# Patient Record
Sex: Female | Born: 1985
Health system: Southern US, Community
[De-identification: ages and names within clinical notes are randomized; demographics above are authoritative.]

## PROBLEM LIST (undated history)

## (undated) DIAGNOSIS — T7840XA Allergy, unspecified, initial encounter: Secondary | ICD-10-CM

## (undated) DIAGNOSIS — G43909 Migraine, unspecified, not intractable, without status migrainosus: Secondary | ICD-10-CM

## (undated) DIAGNOSIS — F909 Attention-deficit hyperactivity disorder, unspecified type: Secondary | ICD-10-CM

## (undated) DIAGNOSIS — E282 Polycystic ovarian syndrome: Secondary | ICD-10-CM

## (undated) HISTORY — PX: BREAST SURGERY: SHX581

## (undated) HISTORY — DX: Polycystic ovarian syndrome: E28.2

## (undated) HISTORY — DX: Attention-deficit hyperactivity disorder, unspecified type: F90.9

## (undated) HISTORY — DX: Allergy, unspecified, initial encounter: T78.40XA

## (undated) HISTORY — PX: WISDOM TOOTH EXTRACTION: SHX21

## (undated) HISTORY — PX: ORIF WRIST FRACTURE: SHX2133

## (undated) HISTORY — DX: Migraine, unspecified, not intractable, without status migrainosus: G43.909

---

## 2019-01-26 ENCOUNTER — Other Ambulatory Visit: Payer: Self-pay

## 2019-01-26 ENCOUNTER — Encounter: Payer: Self-pay | Admitting: Family Medicine

## 2019-01-26 ENCOUNTER — Ambulatory Visit: Payer: 59 | Admitting: Family Medicine

## 2019-01-26 VITALS — BP 98/60 | HR 78 | Temp 98.3°F | Ht 65.0 in | Wt 135.0 lb

## 2019-01-26 DIAGNOSIS — F900 Attention-deficit hyperactivity disorder, predominantly inattentive type: Secondary | ICD-10-CM | POA: Diagnosis not present

## 2019-01-26 DIAGNOSIS — Z7689 Persons encountering health services in other specified circumstances: Secondary | ICD-10-CM

## 2019-01-26 DIAGNOSIS — Z6822 Body mass index (BMI) 22.0-22.9, adult: Secondary | ICD-10-CM | POA: Diagnosis not present

## 2019-01-26 DIAGNOSIS — Z309 Encounter for contraceptive management, unspecified: Secondary | ICD-10-CM | POA: Diagnosis not present

## 2019-01-26 DIAGNOSIS — Z01419 Encounter for gynecological examination (general) (routine) without abnormal findings: Secondary | ICD-10-CM | POA: Diagnosis not present

## 2019-01-26 MED ORDER — AMPHETAMINE-DEXTROAMPHETAMINE 15 MG PO TABS: 15.0000 mg | ORAL_TABLET | Freq: Every day | ORAL | 0 refills | Status: DC

## 2019-01-26 MED FILL — DEXTROAMP-AMPHETAMIN 15 MG: 15 | 30 days supply | Qty: 30 | Fill #0

## 2019-01-26 MED FILL — DROSPIRENONE-EE 3-0.03 MG T: 3-0.03 | 84 days supply | Qty: 84 | Fill #0

## 2019-01-31 ENCOUNTER — Encounter: Payer: Self-pay | Admitting: Family Medicine

## 2019-01-31 NOTE — Progress Notes (Signed)
Patient presents to clinic today to establish care.  SUBJECTIVE: PMH: Pt is a 33 yo female with pmh sig for ADHD.  Pt previously seen in [place], [place].  ADHD-inattentive: -diagnosed while in med school -has letter from diagnosing physician  09/13/2009 -Adderall 15 mg daily -does not need every day.  Takes consistently when studying.  Has specialty boards in a few months.  Allergies: Bactrim- rash Chlorhexidine- rash  Past Surgical hx:  L wrist fx  Social Hx: Pt is single.  Pt went to medical school at the [employer] [place].  She is a Pulm/critical care physician.  Pt denies tobacco or drug use.  Pt endorses social EtOH use.  Health Maintenance: Immunizations -- tdap 01/03/2016 Followed by Rushie Chestnut  Family Medical hx: Siblings-ADHD  Past Medical History:  Diagnosis Date  . ADHD   . Allergy   . Migraine   . PCOS (polycystic ovarian syndrome)     Past Surgical History:  Procedure Laterality Date  . BREAST SURGERY    . ORIF WRIST FRACTURE    . WISDOM TOOTH EXTRACTION      Current Outpatient Medications on File Prior to Visit  Medication Sig Dispense Refill  . cetirizine (ZYRTEC) 10 MG tablet Take 10 mg by mouth daily.     No current facility-administered medications on file prior to visit.     Allergies  Allergen Reactions  . Bactrim [Sulfamethoxazole-Trimethoprim] Rash  . Chlorhexidine Rash    Family History  Problem Relation Age of Onset  . Hypertension Mother   . Miscarriages / [place] Mother   . Arthritis Father   . Depression Father   . Hyperlipidemia Father   . Learning disabilities Father   . Depression Sister   . Learning disabilities Sister   . Learning disabilities Brother   . Asthma Brother   . Cancer Maternal Grandfather   . Heart attack Maternal Grandfather   . Heart attack Paternal Grandmother   . Heart disease Paternal Grandmother     Social History   Socioeconomic History  . Marital status: Single    Spouse name: Not on  file  . Number of children: Not on file  . Years of education: Not on file  . Highest education level: Not on file  Occupational History  . Not on file  Social Needs  . Financial resource strain: Not on file  . Food insecurity    Worry: Not on file    Inability: Not on file  . Transportation needs    Medical: Not on file    Non-medical: Not on file  Tobacco Use  . Smoking status: Never Smoker  . Smokeless tobacco: Never Used  Substance and Sexual Activity  . Alcohol use: Yes    Comment: occassional  . Drug use: Never  . Sexual activity: Not Currently  Lifestyle  . Physical activity    Days per week: Not on file    Minutes per session: Not on file  . Stress: Not on file  Relationships  . Social Herbalist on phone: Not on file    Gets together: Not on file    Attends religious service: Not on file    Active member of club or organization: Not on file    Attends meetings of clubs or organizations: Not on file    Relationship status: Not on file  . Intimate partner violence    Fear of current or ex partner: Not on file    Emotionally  abused: Not on file    Physically abused: Not on file    Forced sexual activity: Not on file  Other Topics Concern  . Not on file  Social History Narrative  . Not on file    ROS General: Denies fever, chills, night sweats, changes in weight, changes in appetite HEENT: Denies headaches, ear pain, changes in vision, rhinorrhea, sore throat CV: Denies CP, palpitations, SOB, orthopnea Pulm: Denies SOB, cough, wheezing GI: Denies abdominal pain, nausea, vomiting, diarrhea, constipation GU: Denies dysuria, hematuria, frequency, vaginal discharge Msk: Denies muscle cramps, joint pains Neuro: Denies weakness, numbness, tingling Skin: Denies rashes, bruising Psych: Denies depression, anxiety, hallucinations  +ADHD  BP 98/60 (BP Location: Right Arm, Patient Position: Sitting, Cuff Size: Normal)   Pulse 78   Temp 98.3 F (36.8 C)  (Oral)   Ht 5\' 5"  (1.651 m)   Wt 135 lb (61.2 kg)   LMP 01/15/2019 (Exact Date)   SpO2 98%   BMI 22.47 kg/m   Physical Exam Gen. Pleasant, well developed, well-nourished, in NAD HEENT - [place]/AT, PERRL, conjunctive clear, no scleral icterus, no nasal drainage Lungs: no use of accessory muscles, CTAB, no wheezes, rales or rhonchi Cardiovascular: RRR, No r/g/m, no peripheral edema Abdomen: BS present, soft, nontender,nondistended Musculoskeletal: No deformities, moves all four extremities, no cyanosis or clubbing, normal tone Neuro:  A&Ox3, CN II-XII intact, normal gait Skin:  Warm, dry, intact, no lesions  No results found for this or any previous visit (from the past 2160 hour(s)).  Assessment/Plan: Attention deficit hyperactivity disorder (ADHD), predominantly inattentive type  -records reviewed  Dx'd with ADHD in 2011 -continue stimulants - Plan: amphetamine-dextroamphetamine (ADDERALL) 15 MG tablet  Encounter to establish care -We reviewed the PMH, PSH, FH, SH, Meds and Allergies. -We provided refills for any medications we will prescribe as needed. -We addressed current concerns per orders and patient instructions. -We have asked for records for pertinent exams, studies, vaccines and notes from previous providers. -We have advised patient to follow up per instructions below.  F/u prn  Grier Mitts, MD

## 2019-04-10 MED FILL — DROSPIRENONE-EE 3-0.03 MG T: 3-0.03 | 84 days supply | Qty: 84 | Fill #1

## 2019-05-22 DIAGNOSIS — Z23 Encounter for immunization: Secondary | ICD-10-CM | POA: Diagnosis not present

## 2019-05-22 DIAGNOSIS — H019 Unspecified inflammation of eyelid: Secondary | ICD-10-CM | POA: Diagnosis not present

## 2019-05-22 DIAGNOSIS — L309 Dermatitis, unspecified: Secondary | ICD-10-CM | POA: Diagnosis not present

## 2019-05-22 MED FILL — DESONIDE 0.05% CREAM: 0.05 | 14 days supply | Qty: 15 | Fill #0

## 2019-07-03 DIAGNOSIS — L814 Other melanin hyperpigmentation: Secondary | ICD-10-CM | POA: Diagnosis not present

## 2019-07-03 DIAGNOSIS — L813 Cafe au lait spots: Secondary | ICD-10-CM | POA: Diagnosis not present

## 2019-07-03 DIAGNOSIS — Z86018 Personal history of other benign neoplasm: Secondary | ICD-10-CM | POA: Diagnosis not present

## 2019-07-03 DIAGNOSIS — Z23 Encounter for immunization: Secondary | ICD-10-CM | POA: Diagnosis not present

## 2019-07-03 DIAGNOSIS — L578 Other skin changes due to chronic exposure to nonionizing radiation: Secondary | ICD-10-CM | POA: Diagnosis not present

## 2019-07-03 DIAGNOSIS — D225 Melanocytic nevi of trunk: Secondary | ICD-10-CM | POA: Diagnosis not present

## 2019-07-14 MED FILL — DROSPIRENONE-EE 3-0.03 MG T: 3-0.03 | 84 days supply | Qty: 84 | Fill #2

## 2019-10-17 MED FILL — DROSPIRENONE-EE 3-0.03 MG T: 3-0.03 | 84 days supply | Qty: 84 | Fill #3

## 2019-11-20 ENCOUNTER — Other Ambulatory Visit (HOSPITAL_COMMUNITY): Payer: Self-pay | Admitting: Dentistry

## 2019-11-20 MED FILL — PREVIDENT 5000 BOOSTER PLUS: 1.1 | 30 days supply | Qty: 100 | Fill #0

## 2020-01-30 ENCOUNTER — Other Ambulatory Visit (HOSPITAL_COMMUNITY): Payer: Self-pay | Admitting: Obstetrics and Gynecology

## 2020-01-30 DIAGNOSIS — Z6821 Body mass index (BMI) 21.0-21.9, adult: Secondary | ICD-10-CM | POA: Diagnosis not present

## 2020-01-30 DIAGNOSIS — Z309 Encounter for contraceptive management, unspecified: Secondary | ICD-10-CM | POA: Diagnosis not present

## 2020-01-30 DIAGNOSIS — Z01419 Encounter for gynecological examination (general) (routine) without abnormal findings: Secondary | ICD-10-CM | POA: Diagnosis not present

## 2020-01-30 MED FILL — DROSPIRENONE-EE 3-0.03 MG T: 3-0.03 | 84 days supply | Qty: 84 | Fill #0

## 2020-05-13 MED FILL — DROSPIRENONE-EE 3-0.03 MG T: 3-0.03 | 84 days supply | Qty: 84 | Fill #1

## 2020-06-04 MED FILL — PREVIDENT 5000 BOOSTER PLUS: 1.1 | 30 days supply | Qty: 100 | Fill #1

## 2020-07-11 ENCOUNTER — Other Ambulatory Visit (HOSPITAL_COMMUNITY): Payer: Self-pay | Admitting: General Surgery

## 2020-07-11 DIAGNOSIS — L578 Other skin changes due to chronic exposure to nonionizing radiation: Secondary | ICD-10-CM | POA: Diagnosis not present

## 2020-07-11 DIAGNOSIS — H019 Unspecified inflammation of eyelid: Secondary | ICD-10-CM | POA: Diagnosis not present

## 2020-07-11 DIAGNOSIS — L813 Cafe au lait spots: Secondary | ICD-10-CM | POA: Diagnosis not present

## 2020-07-11 DIAGNOSIS — L814 Other melanin hyperpigmentation: Secondary | ICD-10-CM | POA: Diagnosis not present

## 2020-07-11 DIAGNOSIS — Z86018 Personal history of other benign neoplasm: Secondary | ICD-10-CM | POA: Diagnosis not present

## 2020-07-11 DIAGNOSIS — D225 Melanocytic nevi of trunk: Secondary | ICD-10-CM | POA: Diagnosis not present

## 2020-07-11 MED FILL — DESONIDE 0.05% CREAM: 0.05 | 14 days supply | Qty: 15 | Fill #0

## 2020-07-11 MED FILL — TACROLIMUS 0.1% OINTMENT: 0.1 | 7 days supply | Qty: 30 | Fill #0

## 2020-07-23 MED FILL — DROSPIRENONE-EE 3-0.03 MG T: 3-0.03 | 84 days supply | Qty: 84 | Fill #2

## 2020-07-26 ENCOUNTER — Ambulatory Visit (HOSPITAL_COMMUNITY)
Admission: EM | Admit: 2020-07-26 | Discharge: 2020-07-26 | Disposition: A | Discharge status: Home / Self Care | Payer: 59 | Attending: Emergency Medicine | Admitting: Emergency Medicine

## 2020-07-26 ENCOUNTER — Other Ambulatory Visit (HOSPITAL_COMMUNITY): Payer: Self-pay | Admitting: Emergency Medicine

## 2020-07-26 ENCOUNTER — Encounter (HOSPITAL_COMMUNITY): Payer: Self-pay

## 2020-07-26 ENCOUNTER — Other Ambulatory Visit: Payer: Self-pay

## 2020-07-26 DIAGNOSIS — J039 Acute tonsillitis, unspecified: Secondary | ICD-10-CM | POA: Diagnosis not present

## 2020-07-26 DIAGNOSIS — J029 Acute pharyngitis, unspecified: Secondary | ICD-10-CM

## 2020-07-26 LAB — POCT RAPID STREP A, ED / UC: Streptococcus, Group A Screen (Direct): NEGATIVE

## 2020-07-26 MED ORDER — AMOXICILLIN 500 MG PO CAPS: 500.0000 mg | ORAL_CAPSULE | Freq: Two times a day (BID) | ORAL | 0 refills | Status: AC

## 2020-07-26 MED FILL — AMOXICILLIN 500 MG CAPSULE: 500 | 7 days supply | Qty: 14 | Fill #0

## 2020-07-26 NOTE — ED Triage Notes (Signed)
Pt in with c/o ST that started last night. States she has noticed white spots on the left side of her throat  Pt has not had medication for sxs

## 2020-07-26 NOTE — ED Provider Notes (Signed)
[facility]   [id number] 07/26/20 Arrival Time: 0801  [place] THROAT  SUBJECTIVE: History from: patient.  Allison Mann is a 35 y.o. female who presents with abrupt onset of sore throat that started over night.  Denies sick exposure to Covid, strep, flu or mono, or precipitating event. Has not tried any OTC treatment. Symptoms are made worse with swallowing, but tolerating liquids and own secretions without difficulty.  Denies previous symptoms in the past.     Denies fever, chills, fatigue, ear pain, sinus pain, rhinorrhea, nasal congestion, cough, SOB, wheezing, chest pain, nausea, rash, changes in bowel or bladder habits.     ROS: As per HPI.  All other pertinent ROS negative.     Past Medical History:  Diagnosis Date  . ADHD   . Allergy   . Migraine   . PCOS (polycystic ovarian syndrome)    Past Surgical History:  Procedure Laterality Date  . BREAST SURGERY    . ORIF WRIST FRACTURE    . WISDOM TOOTH EXTRACTION     Allergies  Allergen Reactions  . Bactrim [Sulfamethoxazole-Trimethoprim] Rash  . Chlorhexidine Rash   No current facility-administered medications on file prior to encounter.   Current Outpatient Medications on File Prior to Encounter  Medication Sig Dispense Refill  . fluticasone (FLONASE) 50 MCG/ACT nasal spray Flonase 50 mcg/actuation nasal spray,suspension  Prescribed by non [facility] MD    . amphetamine-dextroamphetamine (ADDERALL) 15 MG tablet Take 1 tablet by mouth daily. 30 tablet 0  . cetirizine (ZYRTEC) 10 MG tablet Take 10 mg by mouth daily.     Social History   Socioeconomic History  . Marital status: Single    Spouse name: Not on file  . Number of children: Not on file  . Years of education: Not on file  . Highest education level: Not on file  Occupational History  . Not on file  Tobacco Use  . Smoking status: Never Smoker  . Smokeless tobacco: Never Used  Substance and Sexual Activity  . Alcohol use: Yes    Comment:  occassional  . Drug use: Never  . Sexual activity: Not Currently  Other Topics Concern  . Not on file  Social History Narrative  . Not on file   Social Determinants of Health   Financial Resource Strain: Not on file  Food Insecurity: Not on file  Transportation Needs: Not on file  Physical Activity: Not on file  Stress: Not on file  Social Connections: Not on file  Intimate Partner Violence: Not on file   Family History  Problem Relation Age of Onset  . Hypertension Mother   . Miscarriages / [place] Mother   . Arthritis Father   . Depression Father   . Hyperlipidemia Father   . Learning disabilities Father   . Depression Sister   . Learning disabilities Sister   . Learning disabilities Brother   . Asthma Brother   . Cancer Maternal Grandfather   . Heart attack Maternal Grandfather   . Heart attack Paternal Grandmother   . Heart disease Paternal Grandmother     OBJECTIVE:  Vitals:   07/26/20 0815  BP: (!) 144/95  Pulse: 76  Resp: 18  Temp: 98.9 F (37.2 C)  SpO2: 98%     General appearance: alert; appears fatigued, but nontoxic, speaking in full sentences and managing own secretions HEENT: NCAT; Ears: EACs clear, TMs pearly gray with visible cone of light, without erythema; Eyes: PERRL, EOMI grossly; Nose: no obvious rhinorrhea; Throat: oropharynx  clear, tonsils 1+ and mildly erythematous with white tonsillar exudate, uvula midline Neck: supple without LAD Lungs: CTA bilaterally without adventitious breath sounds; cough absent Heart: regular rate and rhythm.  Radial pulses 2+ symmetrical bilaterally Skin: warm and dry Psychological: alert and cooperative; normal mood and affect  LABS: Results for orders placed or performed during the hospital encounter of 07/26/20 (from the past 24 hour(s))  POCT Rapid Strep A     Status: None   Collection Time: 07/26/20  8:52 AM  Result Value Ref Range   Streptococcus, Group A Screen (Direct) NEGATIVE NEGATIVE      ASSESSMENT & PLAN:  1. Tonsillitis     Meds ordered this encounter  Medications  . amoxicillin (AMOXIL) 500 MG capsule    Sig: Take 1 capsule (500 mg total) by mouth 2 (two) times daily for 7 days.    Dispense:  14 capsule    Refill:  0    Order Specific Question:   Supervising Provider    Answer:   Chase Picket [id number]   Tonsillitis.  Prescribed amoxicillin for tonsillitis based on assessment.  Strep test negative, will send out for culture and we will call you with results Declines test for mono at this time Get plenty of rest and push fluids Take OTC Zyrtec and use chloraseptic spray as needed for throat pain. Drink warm or cool liquids, use throat lozenges, or popsicles to help alleviate symptoms Take OTC ibuprofen or tylenol as needed for pain Follow up with PCP if symptoms persists Return or go to ER if patient has any new or worsening symptoms such as fever, chills, nausea, vomiting, worsening sore throat, cough, abdominal pain, chest pain, changes in bowel or bladder habits  Reviewed expectations re: course of current medical issues. Questions answered. Outlined signs and symptoms indicating need for more acute intervention. Patient verbalized understanding. After Visit Summary given.          Pearson Forster, NP 07/26/20 [phone]

## 2020-07-26 NOTE — Discharge Instructions (Addendum)
Take antibiotics as prescribed.   You may also use Ibuprofen/Tylenol as needed for fever reduction and pain relief.  Warm beverages, including hot teas and hot water with lemon, can help to relieve throat pain.  You can also gargle salt water and use throat sprays/lozenges.    Return or go to the Emergency Department if symptoms worsen or do not improve in the next few days.

## 2020-07-28 LAB — CULTURE, GROUP A STREP (THRC)

## 2020-09-17 ENCOUNTER — Other Ambulatory Visit: Payer: Self-pay

## 2020-09-18 ENCOUNTER — Encounter: Payer: Self-pay | Admitting: Family Medicine

## 2020-09-18 ENCOUNTER — Ambulatory Visit (INDEPENDENT_AMBULATORY_CARE_PROVIDER_SITE_OTHER): Payer: 59 | Admitting: Family Medicine

## 2020-09-18 VITALS — BP 108/78 | HR 56 | Temp 98.1°F | Ht 66.0 in | Wt 131.2 lb

## 2020-09-18 DIAGNOSIS — Z131 Encounter for screening for diabetes mellitus: Secondary | ICD-10-CM | POA: Diagnosis not present

## 2020-09-18 DIAGNOSIS — Z0000 Encounter for general adult medical examination without abnormal findings: Secondary | ICD-10-CM | POA: Diagnosis not present

## 2020-09-18 DIAGNOSIS — F900 Attention-deficit hyperactivity disorder, predominantly inattentive type: Secondary | ICD-10-CM

## 2020-09-18 DIAGNOSIS — Z1159 Encounter for screening for other viral diseases: Secondary | ICD-10-CM

## 2020-09-18 DIAGNOSIS — Z13220 Encounter for screening for lipoid disorders: Secondary | ICD-10-CM | POA: Diagnosis not present

## 2020-09-18 NOTE — Progress Notes (Signed)
Subjective:     Allison Mann is a 35 y.o. female and is here for a comprehensive physical exam. The patient reports she has been doing well.  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  Pt using desonide on eyelids for eczema.  If flare is not controlled she uses tacrolimus x1 week.  Followed by dermatology.  Patient with a history of ADHD.  Not currently on Adderall as able to manage daily activities.  Last used medication when had to study for boards/a major exam.  Followed by OB/Gyn.  Social History   Socioeconomic History  . Marital status: Single    Spouse name: Not on file  . Number of children: Not on file  . Years of education: Not on file  . Highest education level: Not on file  Occupational History  . Not on file  Tobacco Use  . Smoking status: Never Smoker  . Smokeless tobacco: Never Used  Substance and Sexual Activity  . Alcohol use: Yes    Comment: occassional  . Drug use: Never  . Sexual activity: Not Currently  Other Topics Concern  . Not on file  Social History Narrative  . Not on file   Social Determinants of Health   Financial Resource Strain: Not on file  Food Insecurity: Not on file  Transportation Needs: Not on file  Physical Activity: Not on file  Stress: Not on file  Social Connections: Not on file  Intimate Partner Violence: Not on file   Health Maintenance  Topic Date Due  . Hepatitis C Screening  Never done  . HIV Screening  Never done  . TETANUS/TDAP  Never done  . PAP SMEAR-Modifier  Never done  . INFLUENZA VACCINE  12/23/2020  . COVID-19 Vaccine  Completed  . HPV VACCINES  Aged Out    The following portions of the patient's history were reviewed and updated as appropriate: allergies, current medications, past family history, past medical history, past social history, past surgical history and problem list.  Review of Systems A comprehensive review of systems was negative.   Objective:    BP 108/78 (BP Location: Left  Arm, Patient Position: Sitting, Cuff Size: Normal)   Pulse (!) 56   Temp 98.1 F (36.7 C) (Oral)   Ht 5\' 6"  (1.676 m)   Wt 131 lb 3.2 oz (59.5 kg)   LMP 09/08/2020   SpO2 99%   BMI 21.18 kg/m  General appearance: alert, cooperative and no distress Head: Normocephalic, without obvious abnormality, atraumatic Eyes: conjunctivae/corneas clear. PERRL, EOM's intact. Fundi benign. Ears: normal TM's and external ear canals both ears Nose: Nares normal. Septum midline. Mucosa normal. No drainage or sinus tenderness. Throat: lips, mucosa, and tongue normal; teeth and gums normal Neck: no adenopathy, no carotid bruit, no JVD, supple, symmetrical, trachea midline and thyroid not enlarged, symmetric, no tenderness/mass/nodules Lungs: clear to auscultation bilaterally Heart: regular rate and rhythm, S1, S2 normal, no murmur, click, rub or gallop Abdomen: soft, non-tender; bowel sounds normal; no masses,  no organomegaly Extremities: extremities normal, atraumatic, no cyanosis or edema Pulses: 2+ and symmetric Skin: Skin color, texture, turgor normal. No rashes or lesions Lymph nodes: Cervical, supraclavicular, and axillary nodes normal. Neurologic: Alert and oriented X 3, normal strength and tone. Normal symmetric reflexes. Normal coordination and gait    Assessment:    Healthy female exam.      Plan:     Anticipatory guidance given including wearing seatbelts, smoke detectors in the home, increasing physical activity,  increasing p.o. intake of water and vegetables. -will obtain labs -Pap up-to-date is followed by OB/GYN -Immunizations up-to-date -Next CPE in 1 year See After Visit Summary for Counseling Recommendations    Screening for cholesterol level  - Plan: Lipid panel  Screening for diabetes mellitus  - Plan: Hemoglobin A1c  Encounter for hepatitis C screening test for low risk patient  - Plan: Hep C Antibody  Attention deficit hyperactivity disorder (ADHD), predominantly  inattentive type -Stable -Not currently on medication -PDMP reviewed -If needed will restart Adderall or discussed other options.  Follow-up as needed  Grier Mitts, MD

## 2020-09-19 LAB — HEPATITIS C ANTIBODY: Hep C Virus Ab: <0.1 s/co ratio (ref 0.0–0.9)

## 2020-09-19 LAB — T4, FREE: Free T4: 1.04 ng/dL (ref 0.82–1.77)

## 2020-09-25 NOTE — Progress Notes (Signed)
Spoke with patient, is aware. 

## 2020-10-15 ENCOUNTER — Encounter: Payer: Self-pay | Admitting: Family Medicine

## 2020-10-24 ENCOUNTER — Other Ambulatory Visit (HOSPITAL_COMMUNITY): Payer: Self-pay

## 2020-10-24 MED FILL — Drospirenone-Ethinyl Estradiol Tab 3-0.03 MG: ORAL | 84 days supply | Qty: 84 | Fill #0 | Status: AC

## 2020-11-06 NOTE — Telephone Encounter (Signed)
Spoke with patient, informed of faxed results. Dr. Volanda Napoleon looked them over, totoal cholesterol was mildly elevated at 228, LDL mildly elevated at 122, other labs are normal.

## 2020-11-06 NOTE — Telephone Encounter (Signed)
A1c is 5.3%

## 2020-12-11 ENCOUNTER — Other Ambulatory Visit (HOSPITAL_BASED_OUTPATIENT_CLINIC_OR_DEPARTMENT_OTHER): Payer: Self-pay

## 2021-01-16 ENCOUNTER — Other Ambulatory Visit: Payer: Self-pay

## 2021-01-16 ENCOUNTER — Other Ambulatory Visit (HOSPITAL_COMMUNITY): Payer: Self-pay

## 2021-01-16 MED ORDER — DROSPIRENONE-ETHINYL ESTRADIOL 3-0.03 MG PO TABS
1.0000 | ORAL_TABLET | Freq: Every day | ORAL | 0 refills | Status: DC
  Filled 2021-01-16: qty 84, 84d supply, fill #0

## 2021-03-05 ENCOUNTER — Other Ambulatory Visit (HOSPITAL_COMMUNITY): Payer: Self-pay

## 2021-03-05 DIAGNOSIS — N6310 Unspecified lump in the right breast, unspecified quadrant: Secondary | ICD-10-CM | POA: Diagnosis not present

## 2021-03-05 DIAGNOSIS — E282 Polycystic ovarian syndrome: Secondary | ICD-10-CM | POA: Diagnosis not present

## 2021-03-05 DIAGNOSIS — Z309 Encounter for contraceptive management, unspecified: Secondary | ICD-10-CM | POA: Diagnosis not present

## 2021-03-05 DIAGNOSIS — Z113 Encounter for screening for infections with a predominantly sexual mode of transmission: Secondary | ICD-10-CM | POA: Diagnosis not present

## 2021-03-05 DIAGNOSIS — Z6820 Body mass index (BMI) 20.0-20.9, adult: Secondary | ICD-10-CM | POA: Diagnosis not present

## 2021-03-05 DIAGNOSIS — B3731 Acute candidiasis of vulva and vagina: Secondary | ICD-10-CM | POA: Diagnosis not present

## 2021-03-05 DIAGNOSIS — Z01419 Encounter for gynecological examination (general) (routine) without abnormal findings: Secondary | ICD-10-CM | POA: Diagnosis not present

## 2021-03-05 MED ORDER — DROSPIRENONE-ETHINYL ESTRADIOL 3-0.03 MG PO TABS
1.0000 | ORAL_TABLET | Freq: Every day | ORAL | 3 refills | Status: DC
  Filled 2021-03-05 – 2021-04-04 (×2): qty 84, 84d supply, fill #0
  Filled 2021-07-07: qty 84, 84d supply, fill #1
  Filled 2021-09-28: qty 84, 84d supply, fill #2
  Filled 2021-12-21: qty 84, 84d supply, fill #3

## 2021-03-05 MED ORDER — FLUCONAZOLE 150 MG PO TABS
150.0000 mg | ORAL_TABLET | ORAL | 1 refills | Status: DC
  Filled 2021-03-05: qty 1, 1d supply, fill #0

## 2021-03-07 ENCOUNTER — Other Ambulatory Visit: Payer: Self-pay | Admitting: Obstetrics and Gynecology

## 2021-03-07 DIAGNOSIS — N6310 Unspecified lump in the right breast, unspecified quadrant: Secondary | ICD-10-CM

## 2021-03-19 ENCOUNTER — Ambulatory Visit
Admission: RE | Admit: 2021-03-19 | Discharge: 2021-03-19 | Disposition: A | Discharge status: Home / Self Care | Payer: 59 | Source: Ambulatory Visit | Attending: Obstetrics and Gynecology | Admitting: Obstetrics and Gynecology

## 2021-03-19 ENCOUNTER — Other Ambulatory Visit: Payer: Self-pay | Admitting: Obstetrics and Gynecology

## 2021-03-19 DIAGNOSIS — R922 Inconclusive mammogram: Secondary | ICD-10-CM | POA: Diagnosis not present

## 2021-03-19 DIAGNOSIS — N6310 Unspecified lump in the right breast, unspecified quadrant: Secondary | ICD-10-CM

## 2021-03-19 DIAGNOSIS — N6313 Unspecified lump in the right breast, lower outer quadrant: Secondary | ICD-10-CM | POA: Diagnosis not present

## 2021-03-19 DIAGNOSIS — N6311 Unspecified lump in the right breast, upper outer quadrant: Secondary | ICD-10-CM | POA: Diagnosis not present

## 2021-03-27 ENCOUNTER — Other Ambulatory Visit: Payer: 59

## 2021-03-31 ENCOUNTER — Ambulatory Visit
Admission: RE | Admit: 2021-03-31 | Discharge: 2021-03-31 | Disposition: A | Discharge status: Home / Self Care | Payer: 59 | Source: Ambulatory Visit | Attending: Obstetrics and Gynecology | Admitting: Obstetrics and Gynecology

## 2021-03-31 DIAGNOSIS — N6310 Unspecified lump in the right breast, unspecified quadrant: Secondary | ICD-10-CM

## 2021-03-31 DIAGNOSIS — N6311 Unspecified lump in the right breast, upper outer quadrant: Secondary | ICD-10-CM | POA: Diagnosis not present

## 2021-03-31 DIAGNOSIS — D241 Benign neoplasm of right breast: Secondary | ICD-10-CM | POA: Diagnosis not present

## 2021-04-01 ENCOUNTER — Other Ambulatory Visit: Payer: 59

## 2021-04-04 ENCOUNTER — Other Ambulatory Visit (HOSPITAL_COMMUNITY): Payer: Self-pay

## 2021-06-27 ENCOUNTER — Other Ambulatory Visit (HOSPITAL_BASED_OUTPATIENT_CLINIC_OR_DEPARTMENT_OTHER): Payer: Self-pay

## 2021-07-07 ENCOUNTER — Other Ambulatory Visit (HOSPITAL_COMMUNITY): Payer: Self-pay

## 2021-07-31 DIAGNOSIS — L578 Other skin changes due to chronic exposure to nonionizing radiation: Secondary | ICD-10-CM | POA: Diagnosis not present

## 2021-07-31 DIAGNOSIS — Z86018 Personal history of other benign neoplasm: Secondary | ICD-10-CM | POA: Diagnosis not present

## 2021-07-31 DIAGNOSIS — L814 Other melanin hyperpigmentation: Secondary | ICD-10-CM | POA: Diagnosis not present

## 2021-07-31 DIAGNOSIS — Z23 Encounter for immunization: Secondary | ICD-10-CM | POA: Diagnosis not present

## 2021-07-31 DIAGNOSIS — D225 Melanocytic nevi of trunk: Secondary | ICD-10-CM | POA: Diagnosis not present

## 2021-07-31 DIAGNOSIS — L813 Cafe au lait spots: Secondary | ICD-10-CM | POA: Diagnosis not present

## 2021-09-29 ENCOUNTER — Other Ambulatory Visit (HOSPITAL_COMMUNITY): Payer: Self-pay

## 2021-12-22 ENCOUNTER — Other Ambulatory Visit (HOSPITAL_COMMUNITY): Payer: Self-pay

## 2021-12-24 ENCOUNTER — Ambulatory Visit (INDEPENDENT_AMBULATORY_CARE_PROVIDER_SITE_OTHER): Payer: 59 | Admitting: Family Medicine

## 2021-12-24 VITALS — BP 120/80 | HR 60 | Temp 98.6°F | Ht 65.0 in | Wt 128.4 lb

## 2021-12-24 DIAGNOSIS — Z0000 Encounter for general adult medical examination without abnormal findings: Secondary | ICD-10-CM

## 2021-12-24 DIAGNOSIS — E782 Mixed hyperlipidemia: Secondary | ICD-10-CM | POA: Diagnosis not present

## 2022-01-04 ENCOUNTER — Encounter: Payer: Self-pay | Admitting: Family Medicine

## 2022-01-04 NOTE — Progress Notes (Signed)
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  Recently went to [place] for vacation.  Not currently having migraines.  In the past has  scotoma/geometric shapes in vision, no HA or nausea.  Had labs done in April for Drs day including CMP, TSK, HgbA1c which were largely normal.  Lipid also drowns.  Total cholesterol elevated at 220 and LDL at 115.  Patient followed by OB/GYN.  Thinks had Tdap in 2017 while out of state.  Social History   Socioeconomic History   Marital status: Single    Spouse name: Not on file   Number of children: Not on file   Years of education: Not on file   Highest education level: Not on file  Occupational History   Not on file  Tobacco Use   Smoking status: Never   Smokeless tobacco: Never  Substance and Sexual Activity   Alcohol use: Yes    Comment: occassional   Drug use: Never   Sexual activity: Not Currently  Other Topics Concern   Not on file  Social History Narrative   Not on file   Social Determinants of Health   Financial Resource Strain: Not on file  Food Insecurity: Not on file  Transportation Needs: Not on file  Physical Activity: Not on file  Stress: Not on file  Social Connections: Not on file  Intimate Partner Violence: Not on file   Health Maintenance  Topic Date Due   HIV Screening  Never done   TETANUS/TDAP  Never done   PAP SMEAR-Modifier  Never done   HPV VACCINES (2 - Risk 3-dose series) 11/20/2008   COVID-19 Vaccine (4 - Pfizer risk series) 04/19/2020   INFLUENZA VACCINE  12/23/2021   Hepatitis C Screening  Completed    The following portions of the patient's history were reviewed and updated as appropriate: allergies, current medications, past family history, past medical history, past social history, past surgical history, and problem list.  Review of Systems Pertinent items noted in  HPI and remainder of comprehensive ROS otherwise negative.   Objective:    BP 120/80 (BP Location: Left Arm, Patient Position: Sitting, Cuff Size: Normal)   Pulse 60   Temp 98.6 F (37 C) (Oral)   Ht '5\' 5"'  (1.651 m)   Wt 128 lb 6.4 oz (58.2 kg)   LMP 12/23/2021 (Exact Date)   SpO2 99%   BMI 21.37 kg/m  General appearance: alert, cooperative, and no distress Head: Normocephalic, without obvious abnormality, atraumatic Eyes: conjunctivae/corneas clear. PERRL, EOM's intact. Fundi benign. Ears: normal TM's and external ear canals both ears Nose: Nares normal. Septum midline. Mucosa normal. No drainage or sinus tenderness. Throat: lips, mucosa, and tongue normal; teeth and gums normal Neck: no adenopathy, no carotid bruit, no JVD, supple, symmetrical, trachea midline, and thyroid not enlarged, symmetric, no tenderness/mass/nodules Lungs: clear to auscultation bilaterally Heart: regular rate and rhythm, S1, S2 normal, no murmur, click, rub or gallop Abdomen: soft, non-tender; bowel sounds normal; no masses,  no organomegaly Extremities: extremities normal, atraumatic, no cyanosis or edema Pulses: 2+ and symmetric Skin: Skin color, texture, turgor normal. No rashes or lesions Lymph nodes: Cervical, supraclavicular, and axillary nodes normal. Neurologic: Alert and oriented X 3, normal strength and tone. Normal symmetric reflexes. Normal coordination and gait    Assessment:    Healthy female exam.  Plan:    Anticipatory guidance given including wearing seatbelts, smoke detectors in the home, increasing physical activity, increasing p.o. intake of water and vegetables. -labs reviewed from 09/09/2021.  CMP (gluc 93, creat 0.94,eGFR 81,K+ 4.4, Na 139,Ca2+ 9.3, alk phos 46, AST 17, ALT 11, total protein 6.9), CBC (WBC 7, Hgb 12.8, hct 39.4, MCV 97, Plts 299, abs lymphs mildly elevated at 3.2), TSH 1.45, hgb A1C 5.4 normal.  Cholesterol mildly elevated with total cholesterol 220, LDL 115,  triglycerides 139, and HDL 81. -Pap with OB/GYN -Immunizations reviewed and up-to-date. -Next CPE in 1 year See After Visit Summary for Counseling Recommendations   Mixed hyperlipidemia -Total cholesterol 220, HDL DL 81, LDL 115, triglycerides 139 on 09/09/2021 -Lifestyle modifications -Continue to monitor  Next CPE in 1 yr. otherwise follow-up as needed  Grier Mitts, MD

## 2022-01-19 ENCOUNTER — Ambulatory Visit (INDEPENDENT_AMBULATORY_CARE_PROVIDER_SITE_OTHER): Payer: 59

## 2022-01-19 ENCOUNTER — Ambulatory Visit: Payer: 59 | Admitting: Family Medicine

## 2022-01-19 ENCOUNTER — Encounter: Payer: Self-pay | Admitting: Family Medicine

## 2022-01-19 VITALS — BP 112/68 | HR 65 | Temp 98.6°F | Wt 131.6 lb

## 2022-01-19 DIAGNOSIS — M25531 Pain in right wrist: Secondary | ICD-10-CM
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
## 2022-02-05 ENCOUNTER — Encounter: Payer: Self-pay | Admitting: Family Medicine

## 2022-02-05 ENCOUNTER — Ambulatory Visit: Payer: 59 | Admitting: Family Medicine

## 2022-02-05 ENCOUNTER — Ambulatory Visit (INDEPENDENT_AMBULATORY_CARE_PROVIDER_SITE_OTHER): Payer: 59

## 2022-02-05 VITALS — BP 102/60 | HR 63 | Temp 98.6°F | Wt 127.8 lb

## 2022-02-05 DIAGNOSIS — M25531 Pain in right wrist: Secondary | ICD-10-CM
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
## 2022-03-03 ENCOUNTER — Other Ambulatory Visit (HOSPITAL_COMMUNITY): Payer: Self-pay

## 2022-03-04 ENCOUNTER — Other Ambulatory Visit (HOSPITAL_COMMUNITY): Payer: Self-pay

## 2022-03-04 MED ORDER — DROSPIRENONE-ETHINYL ESTRADIOL 3-0.03 MG PO TABS
1.0000 | ORAL_TABLET | Freq: Every day | ORAL | 0 refills | Status: DC
  Filled 2022-03-04: qty 84, 84d supply, fill #0

## 2022-05-08 ENCOUNTER — Other Ambulatory Visit (HOSPITAL_COMMUNITY): Payer: Self-pay

## 2022-05-08 DIAGNOSIS — Z113 Encounter for screening for infections with a predominantly sexual mode of transmission: Secondary | ICD-10-CM | POA: Diagnosis not present

## 2022-05-08 DIAGNOSIS — Z309 Encounter for contraceptive management, unspecified: Secondary | ICD-10-CM | POA: Diagnosis not present

## 2022-05-08 DIAGNOSIS — R300 Dysuria: Secondary | ICD-10-CM | POA: Diagnosis not present

## 2022-05-08 DIAGNOSIS — Z124 Encounter for screening for malignant neoplasm of cervix: Secondary | ICD-10-CM | POA: Diagnosis not present

## 2022-05-08 DIAGNOSIS — E282 Polycystic ovarian syndrome: Secondary | ICD-10-CM | POA: Diagnosis not present

## 2022-05-08 DIAGNOSIS — Z01419 Encounter for gynecological examination (general) (routine) without abnormal findings: Secondary | ICD-10-CM | POA: Diagnosis not present

## 2022-05-08 DIAGNOSIS — Z6822 Body mass index (BMI) 22.0-22.9, adult: Secondary | ICD-10-CM | POA: Diagnosis not present

## 2022-05-08 DIAGNOSIS — Z1151 Encounter for screening for human papillomavirus (HPV): Secondary | ICD-10-CM | POA: Diagnosis not present

## 2022-05-08 MED ORDER — DROSPIRENONE-ETHINYL ESTRADIOL 3-0.03 MG PO TABS
1.0000 | ORAL_TABLET | Freq: Every day | ORAL | 3 refills | Status: DC
  Filled 2022-05-08 – 2022-06-08 (×2): qty 84, 84d supply, fill #0
  Filled 2022-08-30: qty 84, 84d supply, fill #1
  Filled 2022-11-23: qty 84, 84d supply, fill #2
  Filled 2023-02-25: qty 84, 84d supply, fill #3

## 2022-05-18 IMAGING — MG MM BREAST LOCALIZATION CLIP
6 series · 6 of 18 positions shown · non-contrast
Comparison: Previous exam(s).

CLINICAL DATA: Status post right breast ultrasound-guided

EXAM:
3D DIAGNOSTIC RIGHT MAMMOGRAM POST ULTRASOUND BIOPSY

[R MLO synth-2D]
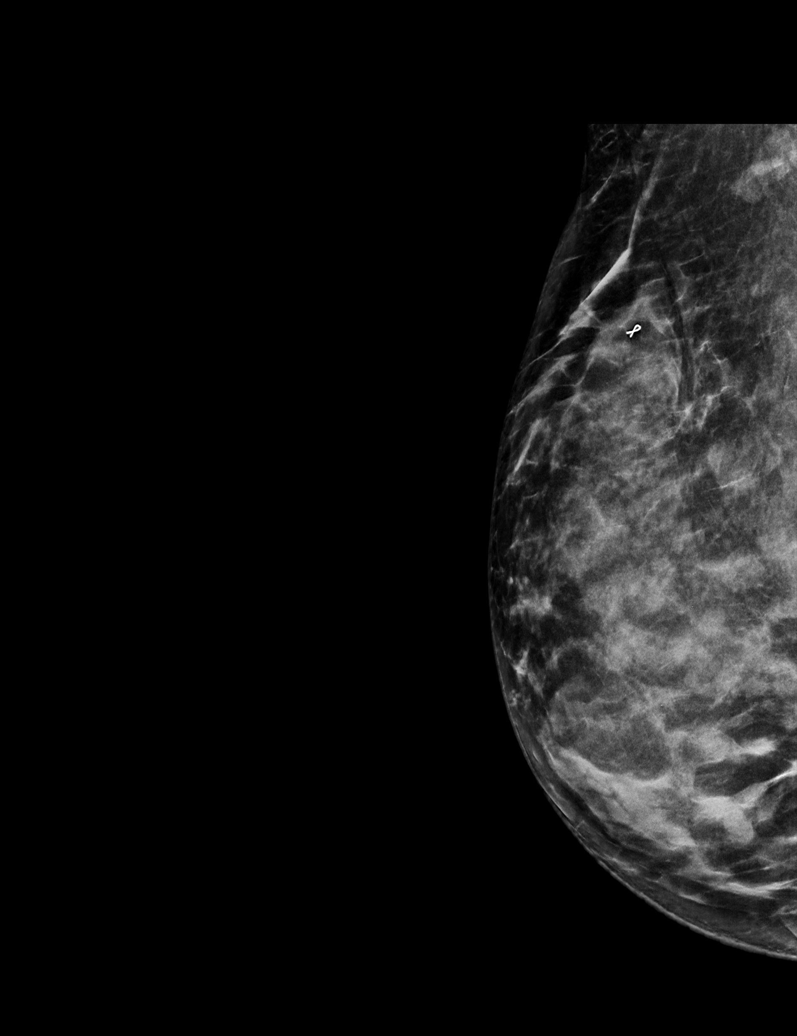

[R ML synth-2D]
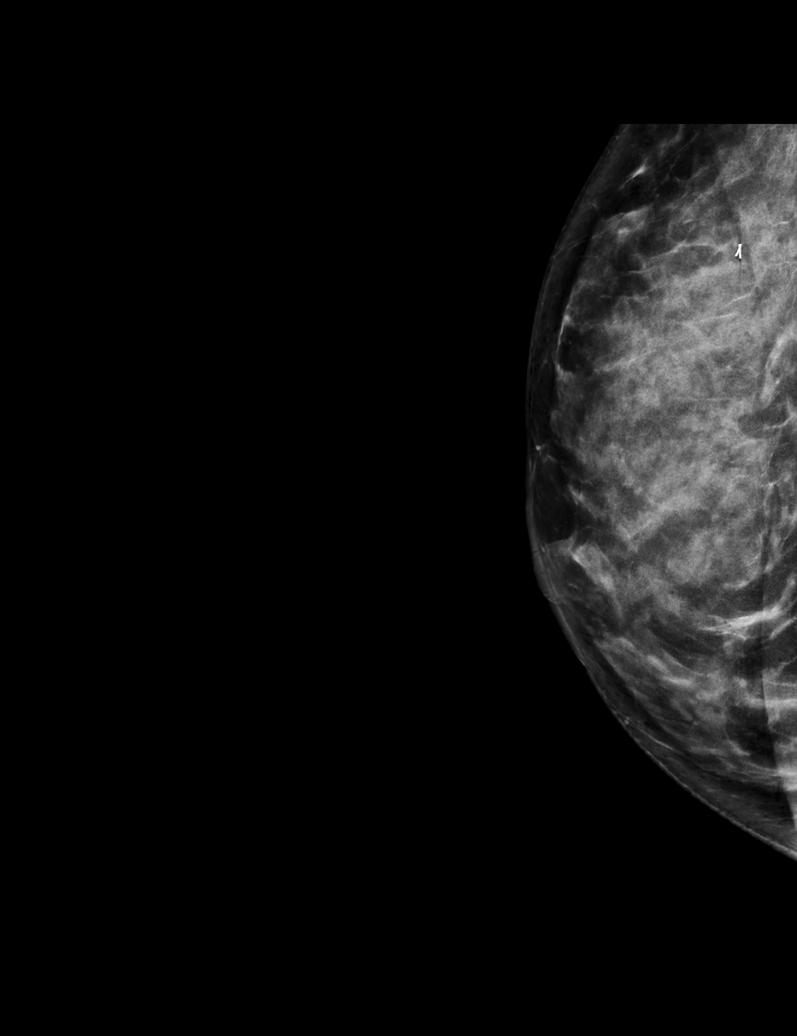

[R XCCL synth-2D]
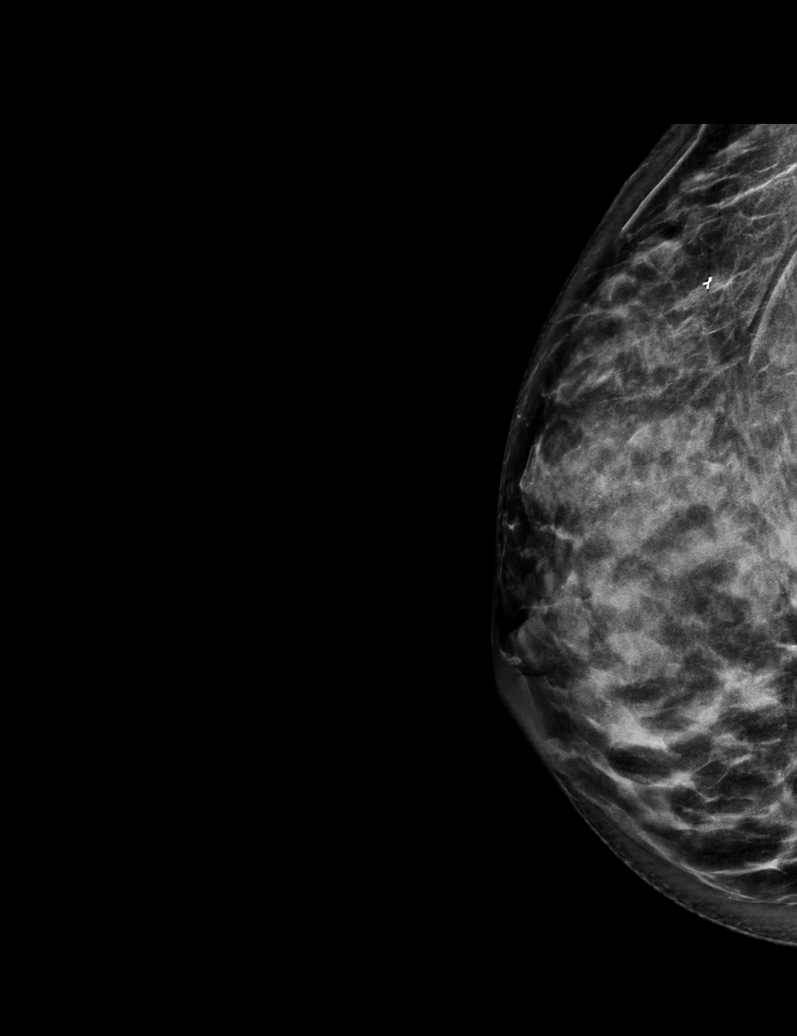

[R ML tomo · tomo slice 35/70.0]
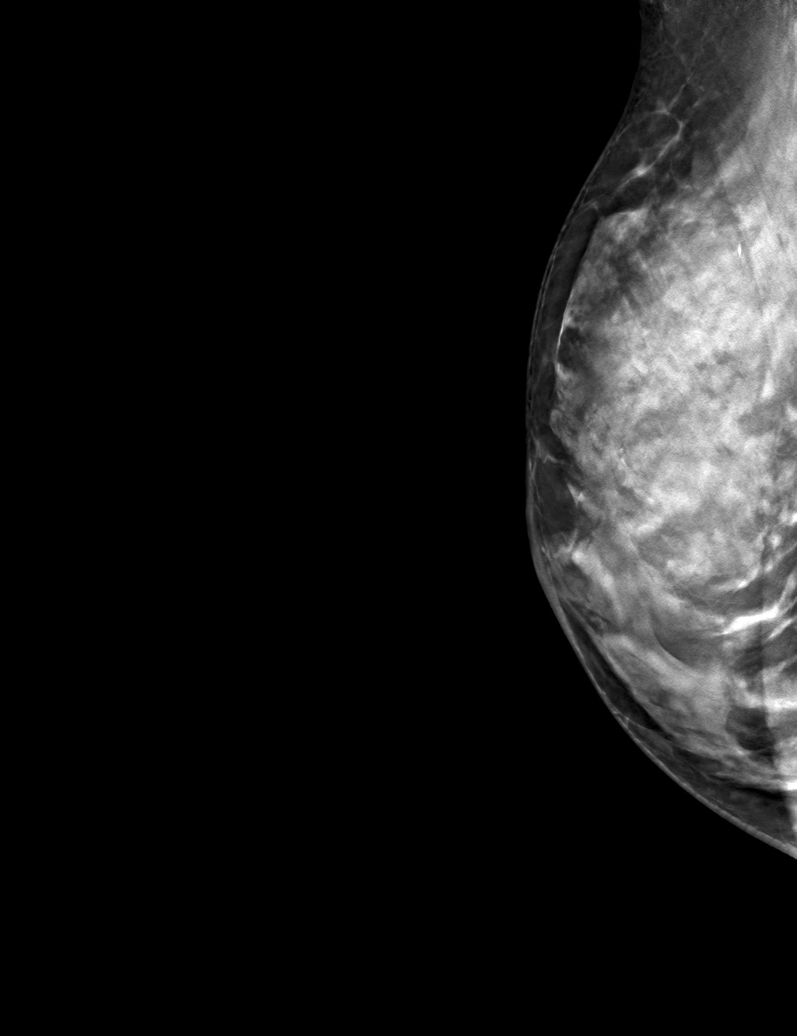

[R XCCL tomo · tomo slice 33/66.0]
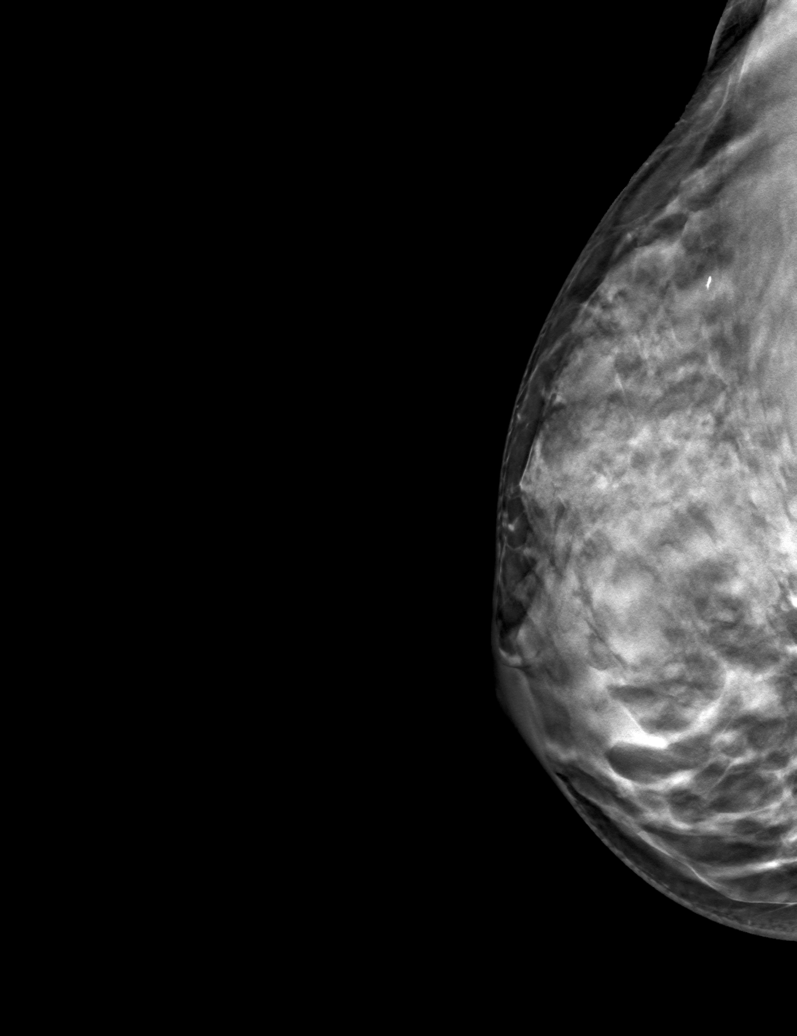

[R MLO tomo · tomo slice 32/63.0]
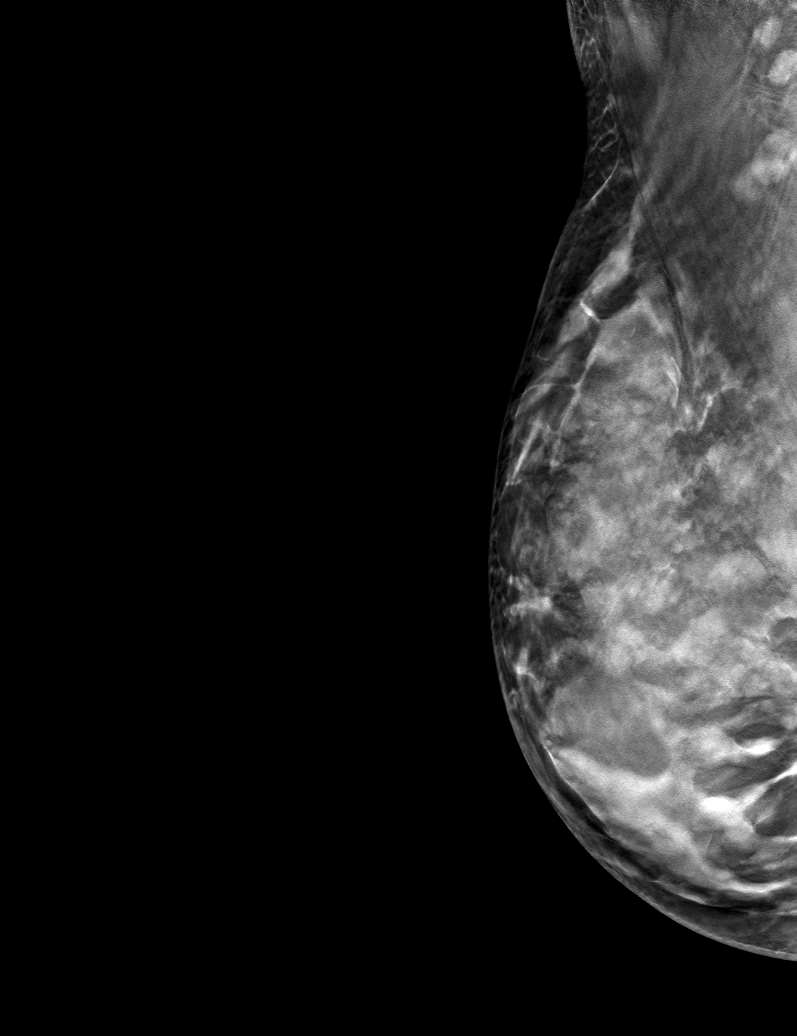

[6 of 18 positions shown; findings below may reference images not displayed]

FINDINGS: 3D Mammographic images were obtained following ultrasound guided
biopsy of right breast. The biopsy marking clip is in expected
position at the site of biopsy.
IMPRESSION: Appropriate positioning of the ribbon shaped biopsy marking clip at
the site of biopsy in the far upper outer right breast.

Final Assessment: Post Procedure Mammograms for Marker Placement

## 2022-06-02 DIAGNOSIS — Z3202 Encounter for pregnancy test, result negative: Secondary | ICD-10-CM | POA: Diagnosis not present

## 2022-06-02 DIAGNOSIS — N870 Mild cervical dysplasia: Secondary | ICD-10-CM | POA: Diagnosis not present

## 2022-06-02 DIAGNOSIS — Z8742 Personal history of other diseases of the female genital tract: Secondary | ICD-10-CM | POA: Diagnosis not present

## 2022-06-08 ENCOUNTER — Other Ambulatory Visit (HOSPITAL_COMMUNITY): Payer: Self-pay

## 2022-06-11 ENCOUNTER — Other Ambulatory Visit (HOSPITAL_COMMUNITY): Payer: Self-pay

## 2022-08-31 ENCOUNTER — Other Ambulatory Visit: Payer: Self-pay

## 2022-11-05 DIAGNOSIS — L814 Other melanin hyperpigmentation: Secondary | ICD-10-CM | POA: Diagnosis not present

## 2022-11-05 DIAGNOSIS — L578 Other skin changes due to chronic exposure to nonionizing radiation: Secondary | ICD-10-CM | POA: Diagnosis not present

## 2022-11-05 DIAGNOSIS — L813 Cafe au lait spots: Secondary | ICD-10-CM | POA: Diagnosis not present

## 2022-11-05 DIAGNOSIS — Z86018 Personal history of other benign neoplasm: Secondary | ICD-10-CM | POA: Diagnosis not present

## 2022-11-05 DIAGNOSIS — D225 Melanocytic nevi of trunk: Secondary | ICD-10-CM | POA: Diagnosis not present

## 2023-02-25 ENCOUNTER — Other Ambulatory Visit (HOSPITAL_COMMUNITY): Payer: Self-pay

## 2023-05-14 ENCOUNTER — Other Ambulatory Visit (HOSPITAL_COMMUNITY): Payer: Self-pay

## 2023-05-14 DIAGNOSIS — E282 Polycystic ovarian syndrome: Secondary | ICD-10-CM | POA: Diagnosis not present

## 2023-05-14 DIAGNOSIS — Z01419 Encounter for gynecological examination (general) (routine) without abnormal findings: Secondary | ICD-10-CM | POA: Diagnosis not present

## 2023-05-14 DIAGNOSIS — Z309 Encounter for contraceptive management, unspecified: Secondary | ICD-10-CM | POA: Diagnosis not present

## 2023-05-14 DIAGNOSIS — Z6822 Body mass index (BMI) 22.0-22.9, adult: Secondary | ICD-10-CM | POA: Diagnosis not present

## 2023-05-14 DIAGNOSIS — R87612 Low grade squamous intraepithelial lesion on cytologic smear of cervix (LGSIL): Secondary | ICD-10-CM | POA: Diagnosis not present

## 2023-05-14 MED ORDER — DROSPIRENONE-ETHINYL ESTRADIOL 3-0.03 MG PO TABS
1.0000 | ORAL_TABLET | Freq: Every day | ORAL | 3 refills | Status: DC
  Filled 2023-05-14: qty 84, 84d supply, fill #0

## 2023-05-25 ENCOUNTER — Other Ambulatory Visit (HOSPITAL_COMMUNITY): Payer: Self-pay

## 2023-06-14 DIAGNOSIS — N870 Mild cervical dysplasia: Secondary | ICD-10-CM | POA: Diagnosis not present

## 2023-06-14 DIAGNOSIS — Z3202 Encounter for pregnancy test, result negative: Secondary | ICD-10-CM | POA: Diagnosis not present

## 2023-06-14 DIAGNOSIS — R87810 Cervical high risk human papillomavirus (HPV) DNA test positive: Secondary | ICD-10-CM | POA: Diagnosis not present

## 2023-11-05 ENCOUNTER — Other Ambulatory Visit (HOSPITAL_COMMUNITY): Payer: Self-pay

## 2023-11-05 DIAGNOSIS — L578 Other skin changes due to chronic exposure to nonionizing radiation: Secondary | ICD-10-CM | POA: Diagnosis not present

## 2023-11-05 DIAGNOSIS — D225 Melanocytic nevi of trunk: Secondary | ICD-10-CM | POA: Diagnosis not present

## 2023-11-05 DIAGNOSIS — L2089 Other atopic dermatitis: Secondary | ICD-10-CM | POA: Diagnosis not present

## 2023-11-05 DIAGNOSIS — L813 Cafe au lait spots: Secondary | ICD-10-CM | POA: Diagnosis not present

## 2023-11-05 DIAGNOSIS — L814 Other melanin hyperpigmentation: Secondary | ICD-10-CM | POA: Diagnosis not present

## 2023-11-05 DIAGNOSIS — Z86018 Personal history of other benign neoplasm: Secondary | ICD-10-CM | POA: Diagnosis not present

## 2023-11-05 MED ORDER — CLOBETASOL PROPIONATE 0.05 % EX SOLN
1.0000 | Freq: Every day | CUTANEOUS | 1 refills | Status: AC
  Filled 2023-11-05: qty 50, 30d supply, fill #0

## 2024-05-29 DIAGNOSIS — N6320 Unspecified lump in the left breast, unspecified quadrant: Secondary | ICD-10-CM

## 2024-06-13 ENCOUNTER — Ambulatory Visit
Admission: RE | Admit: 2024-06-13 | Discharge: 2024-06-13 | Disposition: A | Source: Ambulatory Visit | Attending: Obstetrics and Gynecology

## 2024-06-13 ENCOUNTER — Other Ambulatory Visit: Payer: Self-pay | Admitting: Obstetrics and Gynecology

## 2024-06-13 DIAGNOSIS — N6320 Unspecified lump in the left breast, unspecified quadrant: Secondary | ICD-10-CM

## 2024-06-13 DIAGNOSIS — R928 Other abnormal and inconclusive findings on diagnostic imaging of breast: Secondary | ICD-10-CM

## 2024-06-14 ENCOUNTER — Other Ambulatory Visit (HOSPITAL_COMMUNITY): Payer: Self-pay

## 2024-06-14 ENCOUNTER — Other Ambulatory Visit: Payer: Self-pay

## 2024-06-14 ENCOUNTER — Encounter: Payer: Self-pay | Admitting: Family Medicine

## 2024-06-14 ENCOUNTER — Ambulatory Visit: Admitting: Family Medicine

## 2024-06-14 VITALS — BP 128/68 | HR 69 | Temp 97.9°F | Ht 65.0 in | Wt 137.2 lb

## 2024-06-14 DIAGNOSIS — F900 Attention-deficit hyperactivity disorder, predominantly inattentive type: Secondary | ICD-10-CM

## 2024-06-14 DIAGNOSIS — Z0000 Encounter for general adult medical examination without abnormal findings: Secondary | ICD-10-CM | POA: Diagnosis not present

## 2024-06-14 DIAGNOSIS — Z2989 Encounter for other specified prophylactic measures: Secondary | ICD-10-CM | POA: Diagnosis not present

## 2024-06-14 DIAGNOSIS — Z7184 Encounter for health counseling related to travel: Secondary | ICD-10-CM

## 2024-06-14 DIAGNOSIS — Z23 Encounter for immunization: Secondary | ICD-10-CM

## 2024-06-14 MED ORDER — AMPHETAMINE-DEXTROAMPHETAMINE 15 MG PO TABS: 15.0000 mg | ORAL_TABLET | Freq: Two times a day (BID) | ORAL | 0 refills | Status: DC

## 2024-06-14 MED ORDER — ACETAZOLAMIDE 125 MG PO TABS
ORAL_TABLET | ORAL | 0 refills | Status: AC
  Filled 2024-06-14: qty 30, 15d supply, fill #0

## 2024-06-14 MED ORDER — TYPHOID VACCINE PO CPDR
1.0000 | DELAYED_RELEASE_CAPSULE | ORAL | 0 refills | Status: AC
  Filled 2024-06-14: qty 4, 8d supply, fill #0

## 2024-06-14 MED ORDER — CIPROFLOXACIN HCL 500 MG PO TABS
500.0000 mg | ORAL_TABLET | Freq: Two times a day (BID) | ORAL | 0 refills | Status: AC
  Filled 2024-06-14 (×2): qty 6, 3d supply, fill #0

## 2024-06-14 NOTE — Addendum Note (Signed)
 Addended by: MERCER KIRSCH R on: 06/14/2024 02:01 PM   Modules accepted: Orders

## 2024-06-14 NOTE — Progress Notes (Signed)
 "  Established Patient Office Visit   Subjective  Patient ID: Allison Mann, female    DOB: 08-Jul-1985  Age: 39 y.o. MRN: [id number]  Chief Complaint  Patient presents with   Annual Exam    Pt is a 39 yo female seen for CPE and f/u.  Pt doing well overall.  Work-related stress now improving.  Pt would like to restart ADHD med.  Previously on adderall 15 mg daily.  Used intermittently if studying.  Now noticing difficulty focusing during meetings and impulsiveness in regards to responses.  Pt going to [place] x 10 d next Friday.  Had typhoid vaccine 6 yrs ago.  [place], [place] ~[place],ooo [place] above [place].  Pt had mammogram and u/s for fibrodense breast tissue this wk.  Regular q 6 mo u/s advised.  Prior h/o bxp R breast.    There are no active problems to display for this patient.  Past Medical History:  Diagnosis Date   ADHD    Allergy    Migraine    PCOS (polycystic ovarian syndrome)    Past Surgical History:  Procedure Laterality Date   BREAST SURGERY     ORIF WRIST FRACTURE     WISDOM TOOTH EXTRACTION     Social History[1] Family History  Problem Relation Age of Onset   Hypertension Mother    Miscarriages / Stillbirths Mother    Arthritis Father    Depression Father    Hyperlipidemia Father    Learning disabilities Father    Depression Sister    Learning disabilities Sister    Learning disabilities Brother    Asthma Brother    Cancer Maternal Grandfather    Heart attack Maternal Grandfather    Heart attack Paternal Grandmother    Heart disease Paternal Grandmother    Allergies[2]  ROS Negative unless stated above    Objective:     BP 128/68   Pulse 69   Temp 97.9 F (36.6 C)   Ht 5' 5 (1.651 m)   Wt 137 lb 3.2 oz (62.2 kg)   SpO2 99%   BMI 22.83 kg/m  BP Readings from Last 3 Encounters:  06/14/24 128/68  02/05/22 102/60  01/19/22 112/68   Wt Readings from Last 3 Encounters:  06/14/24 137 lb 3.2 oz (62.2 kg)   02/05/22 127 lb 12.8 oz (58 kg)  01/19/22 131 lb 9.6 oz (59.7 kg)      Physical Exam Constitutional:      Appearance: Normal appearance.  HENT:     Head: Normocephalic and atraumatic.     Right Ear: Tympanic membrane, ear canal and external ear normal.     Left Ear: Tympanic membrane, ear canal and external ear normal.     Nose: Nose normal.     Mouth/Throat:     Mouth: Mucous membranes are moist.     Pharynx: No oropharyngeal exudate or posterior oropharyngeal erythema.  Eyes:     General: No scleral icterus.    Extraocular Movements: Extraocular movements intact.     Conjunctiva/sclera: Conjunctivae normal.     Pupils: Pupils are equal, round, and reactive to light.  Neck:     Thyroid: No thyromegaly.     Vascular: No carotid bruit.  Cardiovascular:     Rate and Rhythm: Normal rate and regular rhythm.     Pulses: Normal pulses.     Heart sounds: Normal heart sounds. No murmur heard.    No friction rub.  Pulmonary:  Effort: Pulmonary effort is normal.     Breath sounds: Normal breath sounds. No wheezing, rhonchi or rales.  Abdominal:     General: Bowel sounds are normal.     Palpations: Abdomen is soft.     Tenderness: There is no abdominal tenderness.  Musculoskeletal:        General: No deformity. Normal range of motion.  Lymphadenopathy:     Cervical: No cervical adenopathy.  Skin:    General: Skin is warm and dry.     Findings: No lesion.  Neurological:     General: No focal deficit present.     Mental Status: She is alert and oriented to person, place, and time.  Psychiatric:        Mood and Affect: Mood normal.        Thought Content: Thought content normal.        06/14/2024    9:53 AM 01/19/2022    3:39 PM 12/24/2021    4:04 PM  Depression screen PHQ 2/9  Decreased Interest  0 0  Down, Depressed, Hopeless 0 0 0  PHQ - 2 Score 0 0 0  Altered sleeping 0 0 0  Tired, decreased energy 0 0 0  Change in appetite 1 0 0  Feeling bad or failure about  yourself  0 0 0  Trouble concentrating 0 0 0  Moving slowly or fidgety/restless 1 0 0  Suicidal thoughts 0 0 0  PHQ-9 Score 2 0  0   Difficult doing work/chores Not difficult at all Not difficult at all      Data saved with a previous flowsheet row definition      06/14/2024    9:55 AM 09/18/2020    9:37 AM  GAD 7 : Generalized Anxiety Score  Nervous, Anxious, on Edge 1 0   Control/stop worrying 0 0   Worry too much - different things 0 0   Trouble relaxing 1 0   Restless 0 0   Easily annoyed or irritable 0 0   Afraid - awful might happen 0 0   Total GAD 7 Score 2 0  Anxiety Difficulty Not difficult at all      Data saved with a previous flowsheet row definition     No results found for any visits on 06/14/24.    Assessment & Plan:   Well adult exam  Attention deficit hyperactivity disorder (ADHD), predominantly inattentive type -     Amphetamine -Dextroamphetamine ; Take 1 tablet by mouth 2 (two) times daily.  Dispense: 180 tablet; Refill: 0  Need for immunization against typhoid -     Typhoid Vaccine; Take 1 capsule by mouth every other day.  Dispense: 4 capsule; Refill: 0  Travel advice encounter -     Ciprofloxacin  HCl; Take 1 tablet (500 mg total) by mouth 2 (two) times daily for 3 days.  Dispense: 6 tablet; Refill: 0  Altitude sickness prophylaxis -     acetaZOLAMIDE ; On the day prior to ascent take 1 tablet (125 mg) by mouth twice a day.  If remaining at target elevation, continue for 2 days if ascent was gradual or for 2 to 4 days if ascent was faster than recommended rates. If descending immediately, may discontinue use once descent started.  Dispense: 30 tablet; Refill: 0  Age appropriate health screenings discussed.  Declines labs this visit.  Will obtain in 2 months at the hospital.  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 Pap with OB/Gyn, done 05/11/24.  Restart Adderall 15 mg.  PDMP appropriate.  Upcoming travel to [place].  Short  course of Cipro  provided as needed for severe traveler's diarrhea.  Rx for typhoid vaccine pills sent to pharmacy.  Acetazolamide  for altitude sickness sent to pharmacy.  Patient advised to increase water intake and monitor BP is typically low normal.  Return in about 1 year (around 06/14/2025) for physical.  f/u in 3-6 mo fo ADHD.SABRA   Clotilda JONELLE Single, MD     [1]  Social History Tobacco Use   Smoking status: Never   Smokeless tobacco: Never  Substance Use Topics   Alcohol use: Yes    Comment: occassional   Drug use: Never  [2]  Allergies Allergen Reactions   Bactrim [Sulfamethoxazole-Trimethoprim] Rash   Chlorhexidine Rash   "

## 2024-06-15 ENCOUNTER — Encounter: Payer: Self-pay | Admitting: Family Medicine

## 2024-06-16 ENCOUNTER — Encounter: Payer: Self-pay | Admitting: Pharmacist

## 2024-06-16 ENCOUNTER — Other Ambulatory Visit: Payer: Self-pay | Admitting: Family Medicine

## 2024-06-16 ENCOUNTER — Other Ambulatory Visit (HOSPITAL_COMMUNITY): Payer: Self-pay

## 2024-06-16 DIAGNOSIS — F900 Attention-deficit hyperactivity disorder, predominantly inattentive type: Secondary | ICD-10-CM

## 2024-06-16 MED ORDER — AMPHETAMINE-DEXTROAMPHETAMINE 15 MG PO TABS
15.0000 mg | ORAL_TABLET | Freq: Two times a day (BID) | ORAL | 0 refills | Status: AC
  Filled 2024-06-16: qty 180, 90d supply, fill #0

## 2024-12-15 ENCOUNTER — Other Ambulatory Visit
